# Patient Record
Sex: Female | Born: 1985 | Race: Black or African American | Hispanic: No | Marital: Single | Smoking: Former smoker
Health system: Southern US, Community
[De-identification: ages and names within clinical notes are randomized; demographics above are authoritative.]

## PROBLEM LIST (undated history)

## (undated) DIAGNOSIS — D649 Anemia, unspecified: Secondary | ICD-10-CM

## (undated) HISTORY — DX: Anemia, unspecified: D64.9

---

## 2015-05-31 DIAGNOSIS — F17200 Nicotine dependence, unspecified, uncomplicated: Secondary | ICD-10-CM | POA: Insufficient documentation

## 2015-05-31 DIAGNOSIS — R51 Headache: Secondary | ICD-10-CM | POA: Insufficient documentation

## 2015-06-01 ENCOUNTER — Encounter (HOSPITAL_BASED_OUTPATIENT_CLINIC_OR_DEPARTMENT_OTHER): Payer: Self-pay

## 2015-06-01 ENCOUNTER — Emergency Department (HOSPITAL_BASED_OUTPATIENT_CLINIC_OR_DEPARTMENT_OTHER): Payer: Medicaid Other

## 2015-06-01 ENCOUNTER — Emergency Department (HOSPITAL_BASED_OUTPATIENT_CLINIC_OR_DEPARTMENT_OTHER)
Admission: EM | Admit: 2015-06-01 | Discharge: 2015-06-01 | Disposition: A | Discharge status: Home / Self Care | Payer: Medicaid Other | Attending: Emergency Medicine | Admitting: Emergency Medicine

## 2015-06-01 ENCOUNTER — Emergency Department (HOSPITAL_COMMUNITY)
Admission: EM | Admit: 2015-06-01 | Discharge: 2015-06-01 | Discharge status: Against Medical Advice | Payer: Medicaid Other | Attending: Emergency Medicine | Admitting: Emergency Medicine

## 2015-06-01 ENCOUNTER — Encounter (HOSPITAL_COMMUNITY): Payer: Self-pay | Admitting: *Deleted

## 2015-06-01 DIAGNOSIS — E119 Type 2 diabetes mellitus without complications: Secondary | ICD-10-CM | POA: Insufficient documentation

## 2015-06-01 DIAGNOSIS — J029 Acute pharyngitis, unspecified: Secondary | ICD-10-CM | POA: Insufficient documentation

## 2015-06-01 DIAGNOSIS — R630 Anorexia: Secondary | ICD-10-CM | POA: Insufficient documentation

## 2015-06-01 DIAGNOSIS — R102 Pelvic and perineal pain: Secondary | ICD-10-CM

## 2015-06-01 DIAGNOSIS — N83209 Unspecified ovarian cyst, unspecified side: Secondary | ICD-10-CM

## 2015-06-01 DIAGNOSIS — M791 Myalgia: Secondary | ICD-10-CM | POA: Insufficient documentation

## 2015-06-01 DIAGNOSIS — I10 Essential (primary) hypertension: Secondary | ICD-10-CM | POA: Insufficient documentation

## 2015-06-01 DIAGNOSIS — Z79899 Other long term (current) drug therapy: Secondary | ICD-10-CM | POA: Insufficient documentation

## 2015-06-01 LAB — COMPREHENSIVE METABOLIC PANEL
ALBUMIN: 3.6 g/dL (ref 3.5–5.0)
ALK PHOS: 45 U/L (ref 38–126)
ALT: 20 U/L (ref 14–54)
ANION GAP: 9 (ref 5–15)
AST: 21 U/L (ref 15–41)
BUN: 11 mg/dL (ref 6–20)
CALCIUM: 9.1 mg/dL (ref 8.9–10.3)
CO2: 25 mmol/L (ref 22–32)
CREATININE: 0.85 mg/dL (ref 0.44–1.00)
Chloride: 106 mmol/L (ref 101–111)
GFR calc Af Amer: >60 mL/min (ref >60)
GFR calc non Af Amer: >60 mL/min (ref >60)
GLUCOSE: 116 mg/dL — AB (ref 65–99)
Potassium: 3.6 mmol/L (ref 3.5–5.1)
SODIUM: 140 mmol/L (ref 135–145)
TOTAL PROTEIN: 6.5 g/dL (ref 6.5–8.1)

## 2015-06-01 LAB — URINALYSIS, ROUTINE W REFLEX MICROSCOPIC
BILIRUBIN URINE: NEGATIVE
GLUCOSE, UA: NEGATIVE mg/dL
Glucose, UA: NEGATIVE mg/dL
HGB URINE DIPSTICK: NEGATIVE
HGB URINE DIPSTICK: NEGATIVE
KETONES UR: NEGATIVE mg/dL
Ketones, ur: NEGATIVE mg/dL
Leukocytes, UA: NEGATIVE
Leukocytes, UA: NEGATIVE
NITRITE: NEGATIVE
Nitrite: NEGATIVE
PROTEIN: NEGATIVE mg/dL
Protein, ur: NEGATIVE mg/dL
SPECIFIC GRAVITY, URINE: 1.025 (ref 1.005–1.030)
Specific Gravity, Urine: 1.024 (ref 1.005–1.030)
pH: 6 (ref 5.0–8.0)
pH: 7 (ref 5.0–8.0)

## 2015-06-01 LAB — CBC WITH DIFFERENTIAL/PLATELET
Basophils Absolute: 0 10*3/uL (ref 0.0–0.1)
Basophils Relative: 0 %
EOS ABS: 0.2 10*3/uL (ref 0.0–0.7)
EOS PCT: 2 %
HCT: 30.1 % — ABNORMAL LOW (ref 36.0–46.0)
Hemoglobin: 9.6 g/dL — ABNORMAL LOW (ref 12.0–15.0)
Lymphocytes Relative: 34 %
Lymphs Abs: 3.3 10*3/uL (ref 0.7–4.0)
MCH: 23.1 pg — AB (ref 26.0–34.0)
MCHC: 31.9 g/dL (ref 30.0–36.0)
MCV: 72.5 fL — ABNORMAL LOW (ref 78.0–100.0)
MONOS PCT: 10 %
Monocytes Absolute: 1 10*3/uL (ref 0.1–1.0)
Neutro Abs: 5.2 10*3/uL (ref 1.7–7.7)
Neutrophils Relative %: 54 %
PLATELETS: 306 10*3/uL (ref 150–400)
RBC: 4.15 MIL/uL (ref 3.87–5.11)
RDW: 16.6 % — ABNORMAL HIGH (ref 11.5–15.5)
WBC: 9.7 10*3/uL (ref 4.0–10.5)

## 2015-06-01 LAB — WET PREP, GENITAL
Sperm: NONE SEEN
TRICH WET PREP: NONE SEEN
Yeast Wet Prep HPF POC: NONE SEEN

## 2015-06-01 LAB — CBC
HCT: 31.5 % — ABNORMAL LOW (ref 36.0–46.0)
Hemoglobin: 10.4 g/dL — ABNORMAL LOW (ref 12.0–15.0)
MCH: 24 pg — AB (ref 26.0–34.0)
MCHC: 33 g/dL (ref 30.0–36.0)
MCV: 72.7 fL — ABNORMAL LOW (ref 78.0–100.0)
PLATELETS: 315 10*3/uL (ref 150–400)
RBC: 4.33 MIL/uL (ref 3.87–5.11)
RDW: 16.3 % — AB (ref 11.5–15.5)
WBC: 10.1 10*3/uL (ref 4.0–10.5)

## 2015-06-01 LAB — PREGNANCY, URINE: PREG TEST UR: NEGATIVE

## 2015-06-01 LAB — LIPASE, BLOOD: Lipase: 23 U/L (ref 11–51)

## 2015-06-01 LAB — POC URINE PREG, ED: Preg Test, Ur: NEGATIVE

## 2015-06-01 MED ORDER — KETOROLAC TROMETHAMINE 60 MG/2ML IM SOLN
30.0000 mg | Freq: Once | INTRAMUSCULAR | Status: AC
  Administered 2015-06-01: 30 mg via INTRAMUSCULAR
  Filled 2015-06-01: qty 2

## 2015-06-01 MED ORDER — NAPROXEN SODIUM 275 MG PO TABS: 275.0000 mg | ORAL_TABLET | Freq: Two times a day (BID) | ORAL | Status: DC

## 2015-06-01 NOTE — ED Notes (Signed)
Pt stated that she does not have "another 3 hours to wait." Tech first stated that she was the next person to get a room and she stated she still wanted to leave. Pt voiced understanding about the risks of leaving.

## 2015-06-01 NOTE — Discharge Instructions (Signed)
Your ultrasound today shows that you had a cyst on your ovary that ruptured. This could cause sudden severe pain. We are treating you for the pain. Follow up with your GYN for further evaluation.  Your blood work today shows that you are anemic. You should be sure your are taking a vitamin every day and iron. It is important that you see your doctor for follow up.

## 2015-06-01 NOTE — ED Notes (Signed)
The pt is c/o being ill for one week.  Headache and pain   lmp last week

## 2015-06-01 NOTE — ED Provider Notes (Signed)
CSN: [id number]     Arrival date & time 06/01/15  1424 History   First MD Initiated Contact with Patient 06/01/15 1527     Chief Complaint  Patient presents with  . Abdominal Pain     (Consider location/radiation/quality/duration/timing/severity/associated sxs/prior Treatment) Patient is a 30 y.o. female presenting with abdominal pain. The history is provided by the patient.  Abdominal Pain Pain location:  LLQ and RLQ Pain quality: aching and squeezing   Pain radiates to:  Does not radiate Pain severity:  Moderate Onset quality:  Gradual Duration:  2 days Timing:  Constant Progression:  Worsening Chronicity:  Recurrent Relieved by:  Heat Ineffective treatments:  OTC medications Associated symptoms: constipation and nausea   Associated symptoms: no diarrhea, no dysuria, no hematuria and no vomiting    Zaelee Biddix is a 30 y.o. female who presents to the ED with abdominal pain. She reports having had problems off and on for the past 6 years but the pain comes and goes. Yesterday she began having pain that is worse than usual. The pain is usually related to menses. LMP 05/20/15.   History reviewed. No pertinent past medical history. History reviewed. No pertinent past surgical history. No family history on file. Social History  Substance Use Topics  . Smoking status: Current Every Day Smoker  . Smokeless tobacco: None  . Alcohol Use: No   OB History    No data available     Review of Systems  Gastrointestinal: Positive for nausea, abdominal pain and constipation. Negative for vomiting and diarrhea.  Genitourinary: Negative for dysuria and hematuria.  all other systems negative    Allergies  Review of patient's allergies indicates no known allergies.  Home Medications   Prior to Admission medications   Medication Sig Start Date End Date Taking? Authorizing Provider  naproxen sodium (ANAPROX) 275 MG tablet Take 1 tablet (275 mg total) by mouth 2 (two) times daily  with a meal. 06/01/15   Hope Bunnie Pion, NP   BP 113/72 mmHg  Pulse 62  Temp(Src) 98.1 F (36.7 C) (Oral)  Resp 18  Ht 5\' 3"  (1.6 m)  Wt 51.71 kg  BMI 20.20 kg/m2  SpO2 100%  LMP 05/20/2015 Physical Exam  Constitutional: She is oriented to person, place, and time. She appears well-developed and well-nourished. No distress.  HENT:  Head: Normocephalic.  Eyes: Conjunctivae and EOM are normal.  Neck: Normal range of motion. Neck supple.  Cardiovascular: Normal rate.   Pulmonary/Chest: Effort normal.  Abdominal: Soft. There is tenderness in the left lower quadrant. There is no rebound, no guarding and no CVA tenderness.  Genitourinary:  External genital without lesions, cervix without lesions, mild CMT, right adnexal tenderness. Uterus without palpable enlargement.   Musculoskeletal: Normal range of motion.  Neurological: She is alert and oriented to person, place, and time. No cranial nerve deficit.  Skin: Skin is warm and dry.  Psychiatric: She has a normal mood and affect. Her behavior is normal.  Nursing note and vitals reviewed.   ED Course  Procedures (including critical care time) Labs, ultrasound, Toradol for pain Pain improved with Toradol  Labs Review Results for orders placed or performed during the hospital encounter of 06/01/15 (from the past 24 hour(s))  Urinalysis, Routine w reflex microscopic (not at [facility])     Status: None   Collection Time: 06/01/15  2:47 PM  Result Value Ref Range   Color, Urine YELLOW YELLOW   APPearance CLEAR CLEAR   Specific Gravity, Urine 1.024  1.005 - 1.030   pH 7.0 5.0 - 8.0   Glucose, UA NEGATIVE NEGATIVE mg/dL   Hgb urine dipstick NEGATIVE NEGATIVE   Bilirubin Urine NEGATIVE NEGATIVE   Ketones, ur NEGATIVE NEGATIVE mg/dL   Protein, ur NEGATIVE NEGATIVE mg/dL   Nitrite NEGATIVE NEGATIVE   Leukocytes, UA NEGATIVE NEGATIVE  Pregnancy, urine     Status: None   Collection Time: 06/01/15  2:47 PM  Result Value Ref Range   Preg Test, Ur  NEGATIVE NEGATIVE  CBC with Differential/Platelet     Status: Abnormal   Collection Time: 06/01/15  3:47 PM  Result Value Ref Range   WBC 9.7 4.0 - 10.5 K/uL   RBC 4.15 3.87 - 5.11 MIL/uL   Hemoglobin 9.6 (L) 12.0 - 15.0 g/dL   HCT 30.1 (L) 36.0 - 46.0 %   MCV 72.5 (L) 78.0 - 100.0 fL   MCH 23.1 (L) 26.0 - 34.0 pg   MCHC 31.9 30.0 - 36.0 g/dL   RDW 16.6 (H) 11.5 - 15.5 %   Platelets 306 150 - 400 K/uL   Neutrophils Relative % 54 %   Neutro Abs 5.2 1.7 - 7.7 K/uL   Lymphocytes Relative 34 %   Lymphs Abs 3.3 0.7 - 4.0 K/uL   Monocytes Relative 10 %   Monocytes Absolute 1.0 0.1 - 1.0 K/uL   Eosinophils Relative 2 %   Eosinophils Absolute 0.2 0.0 - 0.7 K/uL   Basophils Relative 0 %   Basophils Absolute 0.0 0.0 - 0.1 K/uL  Wet prep, genital     Status: Abnormal   Collection Time: 06/01/15  5:35 PM  Result Value Ref Range   Yeast Wet Prep HPF POC NONE SEEN NONE SEEN   Trich, Wet Prep NONE SEEN NONE SEEN   Clue Cells Wet Prep HPF POC PRESENT (A) NONE SEEN   WBC, Wet Prep HPF POC MODERATE (A) NONE SEEN   Sperm NONE SEEN      Imaging Review US Transvaginal Non-ob  06/01/2015  CLINICAL DATA:  Pelvic pain for 6 years EXAM: TRANSABDOMINAL AND TRANSVAGINAL ULTRASOUND OF PELVIS TECHNIQUE: Both transabdominal and transvaginal ultrasound examinations of the pelvis were performed. Transabdominal technique was performed for global imaging of the pelvis including uterus, ovaries, adnexal regions, and pelvic cul-de-sac. It was necessary to proceed with endovaginal exam following the transabdominal exam to visualize the ovaries. COMPARISON:  None FINDINGS: Uterus Measurements: 9.2 x 4.7 x 6.0 cm. No fibroids or other mass visualized. Endometrium Thickness: 12 mm.  No focal abnormality visualized. Right ovary Measurements: 3.5 x 2.3 x 1.2 cm. Normal appearance/no adnexal mass. Left ovary Measurements: 3.6 x 2.2 x 03.4 cm. There is a 2.6 cm cystic lesion with irregular borders identified likely  representing an involuting cyst. Other findings Moderate free fluid is noted. This is likely related to recent cystic rupture. IMPRESSION: Apparent involuting cyst in the left ovary with free pelvic fluid consistent with recent rupture. Electronically Signed   By: Inez Catalina M.D.   On: 06/01/2015 17:08   US Pelvis Complete  06/01/2015  CLINICAL DATA:  Pelvic pain for 6 years EXAM: TRANSABDOMINAL AND TRANSVAGINAL ULTRASOUND OF PELVIS TECHNIQUE: Both transabdominal and transvaginal ultrasound examinations of the pelvis were performed. Transabdominal technique was performed for global imaging of the pelvis including uterus, ovaries, adnexal regions, and pelvic cul-de-sac. It was necessary to proceed with endovaginal exam following the transabdominal exam to visualize the ovaries. COMPARISON:  None FINDINGS: Uterus Measurements: 9.2 x 4.7 x 6.0 cm. No fibroids  or other mass visualized. Endometrium Thickness: 12 mm.  No focal abnormality visualized. Right ovary Measurements: 3.5 x 2.3 x 1.2 cm. Normal appearance/no adnexal mass. Left ovary Measurements: 3.6 x 2.2 x 03.4 cm. There is a 2.6 cm cystic lesion with irregular borders identified likely representing an involuting cyst. Other findings Moderate free fluid is noted. This is likely related to recent cystic rupture. IMPRESSION: Apparent involuting cyst in the left ovary with free pelvic fluid consistent with recent rupture. Electronically Signed   By: Inez Catalina M.D.   On: 06/01/2015 17:08   I have personally reviewed and evaluated these images and lab results as part of my medical decision-making.   MDM  30 y.o. female with pelvic pain that improved with Toradol. Stable for d/c without ovarian torsion or acute abdomen. Discussed with the patient lab, ultrasound and clinical findings and plan of care and all questioned fully answered. She will follow up with her GYN or return here as needed. Rx for Anaprox.    Final diagnoses:  Pelvic pain in female   Ovarian cyst rupture       [facility], NP 06/03/15 2224  Davonna Belling, MD 06/03/15 2240

## 2015-06-01 NOTE — ED Notes (Signed)
Pelvic cart is set up at bedside.

## 2015-06-01 NOTE — ED Notes (Signed)
Patient transported to Ultrasound 

## 2015-06-01 NOTE — ED Notes (Signed)
Pt states she went to [facility] ED late night for same c/o-LWBS

## 2015-06-01 NOTE — ED Notes (Signed)
Abd pain "for years"-worse yesterday-denies n/v/d-also c/o migraine started yesterday

## 2015-06-02 LAB — GC/CHLAMYDIA PROBE AMP (~~LOC~~) NOT AT ARMC
CHLAMYDIA, DNA PROBE: NEGATIVE
NEISSERIA GONORRHEA: NEGATIVE

## 2015-10-13 ENCOUNTER — Encounter: Payer: Self-pay | Admitting: Internal Medicine

## 2016-06-04 ENCOUNTER — Emergency Department (HOSPITAL_BASED_OUTPATIENT_CLINIC_OR_DEPARTMENT_OTHER)
Admission: EM | Admit: 2016-06-04 | Discharge: 2016-06-04 | Disposition: A | Discharge status: Home / Self Care | Payer: No Typology Code available for payment source | Attending: Emergency Medicine | Admitting: Emergency Medicine

## 2016-06-04 ENCOUNTER — Encounter (HOSPITAL_BASED_OUTPATIENT_CLINIC_OR_DEPARTMENT_OTHER): Payer: Self-pay | Admitting: *Deleted

## 2016-06-04 DIAGNOSIS — F17210 Nicotine dependence, cigarettes, uncomplicated: Secondary | ICD-10-CM | POA: Diagnosis not present

## 2016-06-04 DIAGNOSIS — Y9389 Activity, other specified: Secondary | ICD-10-CM | POA: Diagnosis not present

## 2016-06-04 DIAGNOSIS — Y999 Unspecified external cause status: Secondary | ICD-10-CM | POA: Diagnosis not present

## 2016-06-04 DIAGNOSIS — S199XXA Unspecified injury of neck, initial encounter: Secondary | ICD-10-CM | POA: Diagnosis present

## 2016-06-04 DIAGNOSIS — Y92410 Unspecified street and highway as the place of occurrence of the external cause: Secondary | ICD-10-CM | POA: Diagnosis not present

## 2016-06-04 DIAGNOSIS — S161XXA Strain of muscle, fascia and tendon at neck level, initial encounter: Secondary | ICD-10-CM | POA: Diagnosis not present

## 2016-06-04 MED ORDER — METHOCARBAMOL 500 MG PO TABS: 500.0000 mg | ORAL_TABLET | Freq: Three times a day (TID) | ORAL | 0 refills | Status: DC | PRN

## 2016-06-04 MED FILL — METHOCARBAMOL 500 MG TABLET: 500 | 3 days supply | Qty: 10 | Fill #0
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
## 2019-08-14 ENCOUNTER — Other Ambulatory Visit: Payer: Self-pay

## 2019-08-14 ENCOUNTER — Encounter (HOSPITAL_COMMUNITY): Payer: Self-pay

## 2019-08-14 ENCOUNTER — Ambulatory Visit (HOSPITAL_COMMUNITY)
Admission: EM | Admit: 2019-08-14 | Discharge: 2019-08-14 | Disposition: A | Discharge status: Home / Self Care | Payer: BC Managed Care – PPO | Attending: Internal Medicine | Admitting: Internal Medicine

## 2019-08-14 DIAGNOSIS — K047 Periapical abscess without sinus: Secondary | ICD-10-CM | POA: Diagnosis not present

## 2019-08-14 MED ORDER — IBUPROFEN 600 MG PO TABS: 600.0000 mg | ORAL_TABLET | Freq: Four times a day (QID) | ORAL | 0 refills | Status: DC | PRN

## 2019-08-14 MED ORDER — KETOROLAC TROMETHAMINE 30 MG/ML IJ SOLN
30.0000 mg | Freq: Once | INTRAMUSCULAR | Status: AC
  Administered 2019-08-14: 30 mg via INTRAMUSCULAR

## 2019-08-14 MED ORDER — KETOROLAC TROMETHAMINE 30 MG/ML IJ SOLN
INTRAMUSCULAR | Status: AC
  Filled 2019-08-14: qty 1

## 2019-08-14 NOTE — ED Provider Notes (Signed)
[place]    CSN: [id number] Arrival date & time: 08/14/19  1208      History   Chief Complaint Chief Complaint  Patient presents with  . Dental Pain    HPI Jamie Valdez is a 34 y.o. female comes to urgent care with right-sided facial swelling and increasing tooth ache.  Patient was seen at [facility] urgent care couple of days ago.  She was prescribed Augmentin and hydrocodone.  Patient symptoms started on the very day that she was seen in the urgent care.  Patient is compliant with her antibiotics.  Patient started spirits increasing swelling on the right side of the face.  Right maxillary tooth-premolar is a cause of her pain.  She denies any fever or chills.  Pain is currently severe, throbbing, constant, worse with chewing or touch.  Tylenol extra strength provided some relief. HPI  No past medical history on file.  There are no problems to display for this patient.   No past surgical history on file.  OB History   No obstetric history on file.      Home Medications    Prior to Admission medications   Medication Sig Start Date End Date Taking? Authorizing Provider  amoxicillin-clavulanate (AUGMENTIN) 875-125 MG tablet Take by mouth. 08/12/19 08/22/19 Yes [provider]  HYDROcodone-acetaminophen (NORCO/VICODIN) 5-325 MG tablet Take by mouth. 08/12/19  Yes [provider]  ibuprofen (ADVIL) 600 MG tablet Take 1 tablet (600 mg total) by mouth every 6 (six) hours as needed. 08/14/19   Trichelle Lehan, Myrene Galas, MD  methocarbamol (ROBAXIN) 500 MG tablet Take 1 tablet (500 mg total) by mouth every 8 (eight) hours as needed for muscle spasms. 06/04/16   Davonna Belling, MD  naproxen sodium (ANAPROX) 275 MG tablet Take 1 tablet (275 mg total) by mouth 2 (two) times daily with a meal. 06/01/15   Ashley Murrain, NP    Family History Family History  Family history unknown: Yes    Social History Social History   Tobacco Use  . Smoking status: Current  Every Day Smoker    Types: Cigarettes  . Smokeless tobacco: Never Used  Substance Use Topics  . Alcohol use: No  . Drug use: No     Allergies   Patient has no known allergies.   Review of Systems Review of Systems  Constitutional: Negative for activity change, chills, fatigue and fever.  HENT: Positive for dental problem and facial swelling. Negative for sinus pressure, sinus pain and sore throat.   Eyes: Negative for pain and itching.  Respiratory: Negative for chest tightness and shortness of breath.   Cardiovascular: Negative for chest pain.  Gastrointestinal: Negative for abdominal pain, diarrhea, nausea and vomiting.  Musculoskeletal: Negative.   Neurological: Positive for facial asymmetry. Negative for dizziness, light-headedness, numbness and headaches.     Physical Exam Triage Vital Signs ED Triage Vitals  Enc Vitals Group     BP 08/14/19 1238 102/71     Pulse Rate 08/14/19 1238 78     Resp 08/14/19 1238 18     Temp 08/14/19 1238 98.8 F (37.1 C)     Temp Source 08/14/19 1238 Oral     SpO2 08/14/19 1238 100 %     Weight --      Height --      Head Circumference --      Peak Flow --      Pain Score 08/14/19 1236 8     Pain Loc --  Pain Edu? --      Excl. in [place]? --    No data found.  Updated Vital Signs BP 102/71 (BP Location: Left Arm)   Pulse 78   Temp 98.8 F (37.1 C) (Oral)   Resp 18   LMP 08/01/2019   SpO2 100%   Visual Acuity Right Eye Distance:   Left Eye Distance:   Bilateral Distance:    Right Eye Near:   Left Eye Near:    Bilateral Near:     Physical Exam Vitals and nursing note reviewed.  Constitutional:      General: She is in acute distress.     Appearance: She is not ill-appearing.  HENT:     Mouth/Throat:     Comments: Right-sided facial swelling without erythema.  Tenderness on palpation over the gingiva in the area of the first right maxillary premolar Cardiovascular:     Rate and Rhythm: Normal rate and regular  rhythm.     Pulses: Normal pulses.     Heart sounds: Normal heart sounds.  Pulmonary:     Effort: Pulmonary effort is normal.     Breath sounds: Normal breath sounds.  Skin:    Capillary Refill: Capillary refill takes less than 2 seconds.  Neurological:     General: No focal deficit present.     Mental Status: She is alert and oriented to person, place, and time.      UC Treatments / Results  Labs (all labs ordered are listed, but only abnormal results are displayed) Labs Reviewed - No data to display  EKG   Radiology No results found.  Procedures Procedures (including critical care time)  Medications Ordered in UC Medications  ketorolac (TORADOL) 30 MG/ML injection 30 mg (30 mg Intramuscular Given 08/14/19 1344)    Initial Impression / Assessment and Plan / UC Course  I have reviewed the triage vital signs and the nursing notes.  Pertinent labs & imaging results that were available during my care of the patient were reviewed by me and considered in my medical decision making (see chart for details).     1.  Dental abscess: Toradol 30 mg IM x1 dose Ibuprofen 600 mg every 8 hours Tylenol extra strength as needed for breakthrough pain Continue Augmentin Patient is advised to see dentist for dental care. Final Clinical Impressions(s) / UC Diagnoses   Final diagnoses:  Dental abscess   Discharge Instructions   None    ED Prescriptions    Medication Sig Dispense Auth. Provider   ibuprofen (ADVIL) 600 MG tablet Take 1 tablet (600 mg total) by mouth every 6 (six) hours as needed. 30 tablet [place] Dunnigan, Myrene Galas, MD     PDMP not reviewed this encounter.   Chase Picket, MD 08/14/19 1427

## 2019-08-14 NOTE — ED Triage Notes (Signed)
Pt c/o tooth/dental pain for approx two days on lower right side. States she was seen and tx at an urgent care center for the same two days ago and given Rx amoxicillin and hydrocodone. Pt states that yesterday she right facial swelling and upper right tooth pain as well.  Right side of face swollen, lower right eye orbital area swollen/red. Reports bad taste in mouth. Reports fever of 100.0 two nights ago.

## 2020-06-15 ENCOUNTER — Emergency Department (HOSPITAL_COMMUNITY)
Admission: EM | Admit: 2020-06-15 | Discharge: 2020-06-15 | Disposition: A | Discharge status: Home / Self Care | Payer: BC Managed Care – PPO | Attending: Emergency Medicine | Admitting: Emergency Medicine

## 2020-06-15 DIAGNOSIS — F17210 Nicotine dependence, cigarettes, uncomplicated: Secondary | ICD-10-CM | POA: Diagnosis not present

## 2020-06-15 DIAGNOSIS — U071 COVID-19: Secondary | ICD-10-CM | POA: Insufficient documentation

## 2020-06-15 DIAGNOSIS — M791 Myalgia, unspecified site: Secondary | ICD-10-CM | POA: Diagnosis present

## 2020-06-15 LAB — RESP PANEL BY RT-PCR (FLU A&B, COVID) ARPGX2
Influenza A by PCR: NEGATIVE
Influenza B by PCR: NEGATIVE
SARS Coronavirus 2 by RT PCR: POSITIVE — AB

## 2020-06-15 MED ORDER — ONDANSETRON 4 MG PO TBDP
4.0000 mg | ORAL_TABLET | Freq: Once | ORAL | Status: AC
  Administered 2020-06-15: 4 mg via ORAL
  Filled 2020-06-15: qty 1

## 2020-06-15 MED ORDER — CYCLOBENZAPRINE HCL 10 MG PO TABS: 5.0000 mg | ORAL_TABLET | Freq: Three times a day (TID) | ORAL | 0 refills | Status: DC | PRN

## 2020-06-15 MED ORDER — KETOROLAC TROMETHAMINE 15 MG/ML IJ SOLN
15.0000 mg | Freq: Once | INTRAMUSCULAR | Status: AC
  Administered 2020-06-15: 15 mg via INTRAMUSCULAR
  Filled 2020-06-15: qty 1

## 2020-06-15 MED ORDER — ONDANSETRON 4 MG PO TBDP: 4.0000 mg | ORAL_TABLET | Freq: Three times a day (TID) | ORAL | 0 refills | Status: DC | PRN

## 2020-06-15 NOTE — ED Provider Notes (Signed)
[facility] EMERGENCY DEPARTMENT Provider Note   CSN: [id number] Arrival date & time: 06/15/20  1111     History No chief complaint on file.   Jamie Valdez is a 35 y.o. female.  Flulike symptoms to include fatigue chills myalgias, nausea, no vaccine for COVID.  No major medical problems.  Tolerating p.o., breathing comfortably, no chest pain, does have headache but no numbness or tingling no syncope.        No past medical history on file.  There are no problems to display for this patient.   No past surgical history on file.   OB History   No obstetric history on file.     Family History  Family history unknown: Yes    Social History   Tobacco Use  . Smoking status: Current Every Day Smoker    Types: Cigarettes  . Smokeless tobacco: Never Used  Substance Use Topics  . Alcohol use: No  . Drug use: No    Home Medications Prior to Admission medications   Medication Sig Start Date End Date Taking? Authorizing Provider  cyclobenzaprine (FLEXERIL) 10 MG tablet Take 0.5 tablets (5 mg total) by mouth 3 (three) times daily as needed for up to 12 doses for muscle spasms. 06/15/20  Yes Breck Coons, MD  ondansetron (ZOFRAN ODT) 4 MG disintegrating tablet Take 1 tablet (4 mg total) by mouth every 8 (eight) hours as needed for up to 10 doses for nausea or vomiting. 06/15/20  Yes Breck Coons, MD  HYDROcodone-acetaminophen (NORCO/VICODIN) 5-325 MG tablet Take by mouth. 08/12/19   [provider]  ibuprofen (ADVIL) 600 MG tablet Take 1 tablet (600 mg total) by mouth every 6 (six) hours as needed. 08/14/19   Lamptey, Myrene Galas, MD  methocarbamol (ROBAXIN) 500 MG tablet Take 1 tablet (500 mg total) by mouth every 8 (eight) hours as needed for muscle spasms. 06/04/16   Davonna Belling, MD  naproxen sodium (ANAPROX) 275 MG tablet Take 1 tablet (275 mg total) by mouth 2 (two) times daily with a meal. 06/01/15   Ashley Murrain, NP    Allergies    Patient  has no known allergies.  Review of Systems   Review of Systems  Constitutional: Positive for activity change, appetite change, chills and fatigue. Negative for fever.  HENT: Positive for congestion and rhinorrhea.   Respiratory: Negative for cough and shortness of breath.   Cardiovascular: Negative for chest pain and palpitations.  Gastrointestinal: Negative for diarrhea, nausea and vomiting.  Genitourinary: Negative for difficulty urinating and dysuria.  Musculoskeletal: Positive for back pain and myalgias. Negative for arthralgias.  Skin: Negative for rash and wound.  Neurological: Positive for headaches. Negative for light-headedness.    Physical Exam Updated Vital Signs BP 111/77   Pulse (!) 102   Temp 99.5 F (37.5 C) (Oral)   Resp 18   SpO2 100%   Physical Exam Vitals and nursing note reviewed. Exam conducted with a chaperone present.  Constitutional:      General: She is not in acute distress.    Appearance: Normal appearance.  HENT:     Head: Normocephalic and atraumatic.     Right Ear: Tympanic membrane normal.     Left Ear: Tympanic membrane normal.     Nose: No rhinorrhea.     Mouth/Throat:     Mouth: Mucous membranes are moist.  Eyes:     General:        Right eye: No discharge.  Left eye: No discharge.     Conjunctiva/sclera: Conjunctivae normal.  Cardiovascular:     Rate and Rhythm: Normal rate and regular rhythm.     Heart sounds: No murmur heard. No gallop.   Pulmonary:     Effort: Pulmonary effort is normal. No respiratory distress.     Breath sounds: No stridor. No wheezing, rhonchi or rales.  Chest:     Chest wall: No tenderness.  Abdominal:     General: Abdomen is flat. There is no distension.     Palpations: Abdomen is soft.     Tenderness: There is no abdominal tenderness. There is no guarding.  Musculoskeletal:        General: No tenderness or signs of injury.  Skin:    General: Skin is warm and dry.     Capillary Refill:  Capillary refill takes less than 2 seconds.  Neurological:     General: No focal deficit present.     Mental Status: She is alert. Mental status is at baseline.     Motor: No weakness.     Comments: 5 out of 5 motor strength in all extremities, sensation intact throughout, no dysmetria, no dysdiadochokinesia, no ataxia with ambulation, cranial nerves II through XII intact, alert and oriented to person place and time   Psychiatric:        Mood and Affect: Mood normal.        Behavior: Behavior normal.     ED Results / Procedures / Treatments   Labs (all labs ordered are listed, but only abnormal results are displayed) Labs Reviewed  RESP PANEL BY RT-PCR (FLU A&B, COVID) ARPGX2 - Abnormal; Notable for the following components:      Result Value   SARS Coronavirus 2 by RT PCR POSITIVE (*)    All other components within normal limits    EKG None  Radiology No results found.  Procedures Procedures (including critical care time)  Medications Ordered in ED Medications  ketorolac (TORADOL) 15 MG/ML injection 15 mg (has no administration in time range)  ondansetron (ZOFRAN-ODT) disintegrating tablet 4 mg (has no administration in time range)    ED Course  I have reviewed the triage vital signs and the nursing notes.  Pertinent labs & imaging results that were available during my care of the patient were reviewed by me and considered in my medical decision making (see chart for details).    MDM Rules/Calculators/A&P                          Patient does have COVID, symptoms consistent with viral illness, symptomatic control be provided.  No signs of endorgan dysfunction on physical exam or history.  Patient is overall well-appearing, has stable vital signs, tolerating p.o. looks well-hydrated normal work of breathing.  Safe for outpatient management.  Anti-inflammatories antiemetics given.  She is given muscle relaxers that she has bad back spasms at baseline and they are worse  now that she is sick.  No neurologic dysfunction safer discharge home return precautions provided quarantine precautions and instructions given.  Jamie Valdez was evaluated in Emergency Department on 06/15/2020 for the symptoms described in the history of present illness. She was evaluated in the context of the global COVID-19 pandemic, which necessitated consideration that the patient might be at risk for infection with the SARS-CoV-2 virus that causes COVID-19. Institutional protocols and algorithms that pertain to the evaluation of patients at risk for COVID-19 are in a state  of rapid change based on information released by regulatory bodies including the CDC and federal and state organizations. These policies and algorithms were followed during the patient's care in the ED.  Final Clinical Impression(s) / ED Diagnoses Final diagnoses:  COVID    Rx / DC Orders ED Discharge Orders         Ordered    ondansetron (ZOFRAN ODT) 4 MG disintegrating tablet  Every 8 hours PRN        06/15/20 1356    cyclobenzaprine (FLEXERIL) 10 MG tablet  3 times daily PRN        06/15/20 1356           Breck Coons, MD 06/15/20 1359

## 2020-06-15 NOTE — ED Triage Notes (Signed)
Patient complains of covid symptoms for 2-3 days. Body aches, headache, NAD

## 2020-06-15 NOTE — Discharge Instructions (Addendum)
You can take 600 mg of ibuprofen every 6 hours, you can take 1000 mg of Tylenol every 6 hours, you can alternate these every 3 or you can take them together.  Hydrate well with half Gatorade half water or Pedialyte.  Return to the emergency department with inability to take oral hydration and with any new symptoms that you are concerned with, or inability to breathe comfortably.

## 2020-06-15 NOTE — ED Notes (Signed)
Discharge instructions reviewed. Confirmed understanding.

## 2020-06-16 ENCOUNTER — Encounter: Payer: Self-pay | Admitting: Physician Assistant

## 2020-12-11 ENCOUNTER — Encounter (HOSPITAL_COMMUNITY): Payer: Self-pay | Admitting: Emergency Medicine

## 2020-12-11 ENCOUNTER — Emergency Department (HOSPITAL_COMMUNITY)
Admission: EM | Admit: 2020-12-11 | Discharge: 2020-12-11 | Disposition: A | Discharge status: Home / Self Care | Payer: BC Managed Care – PPO | Attending: Emergency Medicine | Admitting: Emergency Medicine

## 2020-12-11 ENCOUNTER — Other Ambulatory Visit: Payer: Self-pay

## 2020-12-11 DIAGNOSIS — K0889 Other specified disorders of teeth and supporting structures: Secondary | ICD-10-CM | POA: Diagnosis present

## 2020-12-11 DIAGNOSIS — K047 Periapical abscess without sinus: Secondary | ICD-10-CM | POA: Insufficient documentation

## 2020-12-11 DIAGNOSIS — F17210 Nicotine dependence, cigarettes, uncomplicated: Secondary | ICD-10-CM | POA: Insufficient documentation

## 2020-12-11 MED ORDER — CHLORHEXIDINE GLUCONATE 0.12 % MT SOLN: 15.0000 mL | Freq: Two times a day (BID) | OROMUCOSAL | 0 refills | Status: DC

## 2020-12-11 MED ORDER — PENICILLIN V POTASSIUM 500 MG PO TABS
500.0000 mg | ORAL_TABLET | Freq: Four times a day (QID) | ORAL | 0 refills | Status: AC
Start: 2020-12-11 — End: 2020-12-21

## 2020-12-11 NOTE — ED Provider Notes (Signed)
[facility] EMERGENCY DEPARTMENT Provider Note   CSN: [id number] Arrival date & time: 12/11/20  1030     History Chief Complaint  Patient presents with   Abscess    dental    Jamie Valdez is a 35 y.o. female.  35 year old female with complaint of left upper dental pain and swelling.  Reports couple dental caries, was chewing something on the left side of her mouth on 4 July when she had significant pain followed by swelling of the left upper gums.  States that it left upper molar had been pulled 10 years ago.  Also has pain in the left ear.  Denies other trauma, fever, drainage.      History reviewed. No pertinent past medical history.  There are no problems to display for this patient.   No past surgical history on file.   OB History   No obstetric history on file.     Family History  Family history unknown: Yes    Social History   Tobacco Use   Smoking status: Every Day    Types: Cigarettes   Smokeless tobacco: Never  Substance Use Topics   Alcohol use: No   Drug use: No    Home Medications Prior to Admission medications   Medication Sig Start Date End Date Taking? Authorizing Provider  chlorhexidine (PERIDEX) 0.12 % solution Use as directed 15 mLs in the mouth or throat 2 (two) times daily. 12/11/20  Yes Tacy Learn, PA-C  penicillin v potassium (VEETID) 500 MG tablet Take 1 tablet (500 mg total) by mouth 4 (four) times daily for 10 days. 12/11/20 12/21/20 Yes Tacy Learn, PA-C  cyclobenzaprine (FLEXERIL) 10 MG tablet Take 0.5 tablets (5 mg total) by mouth 3 (three) times daily as needed for up to 12 doses for muscle spasms. 06/15/20   Breck Coons, MD  HYDROcodone-acetaminophen (NORCO/VICODIN) 5-325 MG tablet Take by mouth. 08/12/19   [provider]  ibuprofen (ADVIL) 600 MG tablet Take 1 tablet (600 mg total) by mouth every 6 (six) hours as needed. 08/14/19   Lamptey, Myrene Galas, MD  methocarbamol (ROBAXIN) 500 MG tablet Take  1 tablet (500 mg total) by mouth every 8 (eight) hours as needed for muscle spasms. 06/04/16   Davonna Belling, MD  naproxen sodium (ANAPROX) 275 MG tablet Take 1 tablet (275 mg total) by mouth 2 (two) times daily with a meal. 06/01/15   Neese, [place], NP  ondansetron (ZOFRAN ODT) 4 MG disintegrating tablet Take 1 tablet (4 mg total) by mouth every 8 (eight) hours as needed for up to 10 doses for nausea or vomiting. 06/15/20   Breck Coons, MD    Allergies    Patient has no known allergies.  Review of Systems   Review of Systems  Constitutional:  Negative for chills and fever.  HENT:  Positive for dental problem, ear pain and facial swelling. Negative for trouble swallowing and voice change.   Gastrointestinal:  Negative for vomiting.  Musculoskeletal:  Negative for neck pain and neck stiffness.  Skin:  Negative for rash and wound.  Allergic/Immunologic: Negative for immunocompromised state.  Neurological:  Negative for headaches.  Hematological:  Negative for adenopathy.  Psychiatric/Behavioral:  Negative for confusion.   All other systems reviewed and are negative.  Physical Exam Updated Vital Signs BP (!) 124/108 (BP Location: Left Arm)   Pulse 95   Temp 99 F (37.2 C) (Oral)   Resp 16   SpO2 98%  Physical Exam Vitals and nursing note reviewed.  Constitutional:      General: She is not in acute distress.    Appearance: She is well-developed. She is not diaphoretic.  HENT:     Head: Normocephalic and atraumatic.     Jaw: No trismus.     Right Ear: Tympanic membrane and ear canal normal.     Left Ear: Tympanic membrane and ear canal normal.     Nose: Nose normal.     Mouth/Throat:     Mouth: Mucous membranes are moist.   Eyes:     Conjunctiva/sclera: Conjunctivae normal.  Pulmonary:     Effort: Pulmonary effort is normal.  Musculoskeletal:     Cervical back: Neck supple.  Lymphadenopathy:     Cervical: No cervical adenopathy.  Skin:    General: Skin is warm and  dry.     Findings: No erythema or rash.  Neurological:     Mental Status: She is alert and oriented to person, place, and time.  Psychiatric:        Behavior: Behavior normal.    ED Results / Procedures / Treatments   Labs (all labs ordered are listed, but only abnormal results are displayed) Labs Reviewed - No data to display  EKG None  Radiology No results found.  Procedures Procedures   Medications Ordered in ED Medications - No data to display  ED Course  I have reviewed the triage vital signs and the nursing notes.  Pertinent labs & imaging results that were available during my care of the patient were reviewed by me and considered in my medical decision making (see chart for details).  Clinical Course as of 12/11/20 1158  Sun Dec 11, 4160  1041 35 year old female with left upper dental pain as above.  Plan is to refer to dentistry as well as resource list given, given prescription for antibiotics and mouth rinse. [LM]    Clinical Course User Index [LM] Roque Lias   MDM Rules/Calculators/A&P                           Final Clinical Impression(s) / ED Diagnoses Final diagnoses:  Dental abscess    Rx / DC Orders ED Discharge Orders          Ordered    penicillin v potassium (VEETID) 500 MG tablet  4 times daily        12/11/20 1153    chlorhexidine (PERIDEX) 0.12 % solution  2 times daily        12/11/20 1153             Roque Lias 12/11/20 1158    Lajean Saver, MD 12/11/20 1417

## 2020-12-11 NOTE — Discharge Instructions (Addendum)
Numbers above for dental care, also resource list.  Take antibiotics as prescribed. Can use prescribed mouth rinse or saltwater or listerine. Continue to brush with a soft toothbrush.  600mg  Advil liquid gel with 650mg  Tylenol every 6 hours as needed for pain.

## 2020-12-11 NOTE — ED Triage Notes (Signed)
Patient coming from home, complaint of dental abscess that has been painful since July 4th

## 2020-12-27 ENCOUNTER — Telehealth: Payer: Self-pay

## 2020-12-27 NOTE — Telephone Encounter (Signed)
   Sabryn Laforte DOB: [birth year] MRN: [id number]   RIDER WAIVER AND RELEASE OF LIABILITY  For purposes of improving physical access to our facilities, [facility] is pleased to partner with third parties to provide [facility] patients or other authorized individuals the option of convenient, on-demand ground transportation services (the [place]") through use of the technology service that enables users to request on-demand ground transportation from independent third-party providers.  By opting to use and accept these [employer], I, the undersigned, hereby agree on behalf of myself, and on behalf of any minor child using the Government social research officer for whom I am the parent or legal guardian, as follows:  Government social research officer provided to me are provided by independent third-party transportation providers who are not [employer] or employees and who are unaffiliated with [employer]. [facility] is neither a transportation carrier nor a common or public carrier. [facility] has no control over the quality or safety of the transportation that occurs as a result of the [employer]. [facility] cannot guarantee that any third-party transportation provider will complete any arranged transportation service. [facility] makes no representation, warranty, or guarantee regarding the reliability, timeliness, quality, safety, suitability, or availability of any of the Transport Services or that they will be error free. I fully understand that traveling by vehicle involves risks and dangers of serious bodily injury, including permanent disability, paralysis, and death. I agree, on behalf of myself and on behalf of any minor child using the Transport Services for whom I am the parent or legal guardian, that the entire risk arising out of my use of the [employer] remains solely with me, to the maximum extent permitted under applicable law. The [employer] are provided "as  is" and "as available." [facility] disclaims all representations and warranties, express, implied or statutory, not expressly set out in these terms, including the implied warranties of merchantability and fitness for a particular purpose. I hereby waive and release [facility], its agents, employees, officers, directors, representatives, insurers, attorneys, assigns, successors, subsidiaries, and affiliates from any and all past, present, or future claims, demands, liabilities, actions, causes of action, or suits of any kind directly or indirectly arising from acceptance and use of the [employer]. I further waive and release [facility] and its affiliates from all present and future liability and responsibility for any injury or death to persons or damages to property caused by or related to the use of the [employer]. I have read this Waiver and Release of Liability, and I understand the terms used in it and their legal significance. This Waiver is freely and voluntarily given with the understanding that my right (as well as the right of any minor child for whom I am the parent or legal guardian using the [employer]) to legal recourse against [facility] in connection with the [employer] is knowingly surrendered in return for use of these services.   I attest that I read the consent document to Bridgett Larsson, gave Ms. Fonnesbeck the opportunity to ask questions and answered the questions asked (if any). I affirm that Bridgett Larsson then provided consent for she's participation in this program.       Cameron Proud

## 2021-06-08 HISTORY — PX: MOUTH SURGERY: SHX715

## 2021-07-10 ENCOUNTER — Other Ambulatory Visit: Payer: Self-pay

## 2021-07-10 ENCOUNTER — Ambulatory Visit (INDEPENDENT_AMBULATORY_CARE_PROVIDER_SITE_OTHER): Payer: 59 | Admitting: Internal Medicine

## 2021-07-10 ENCOUNTER — Encounter: Payer: Self-pay | Admitting: Internal Medicine

## 2021-07-10 VITALS — BP 112/70 | HR 74 | Temp 99.0°F | Ht 63.0 in | Wt 113.4 lb

## 2021-07-10 DIAGNOSIS — Z803 Family history of malignant neoplasm of breast: Secondary | ICD-10-CM

## 2021-07-10 DIAGNOSIS — D259 Leiomyoma of uterus, unspecified: Secondary | ICD-10-CM | POA: Diagnosis not present

## 2021-07-10 DIAGNOSIS — Z1159 Encounter for screening for other viral diseases: Secondary | ICD-10-CM

## 2021-07-10 DIAGNOSIS — N6311 Unspecified lump in the right breast, upper outer quadrant: Secondary | ICD-10-CM | POA: Diagnosis not present

## 2021-07-10 DIAGNOSIS — F4321 Adjustment disorder with depressed mood: Secondary | ICD-10-CM | POA: Diagnosis not present

## 2021-07-10 DIAGNOSIS — D649 Anemia, unspecified: Secondary | ICD-10-CM | POA: Diagnosis not present

## 2021-07-10 DIAGNOSIS — Z2821 Immunization not carried out because of patient refusal: Secondary | ICD-10-CM

## 2021-07-10 NOTE — Progress Notes (Signed)
I,Jamie Valdez,acting as a Education administrator for Jamie Greenland, MD.,have documented all relevant documentation on the behalf of Jamie Greenland, MD,as directed by  Jamie Greenland, MD while in the presence of Jamie Greenland, MD.  This visit occurred during the SARS-CoV-2 public health emergency.  Safety protocols were in place, including screening questions prior to the visit, additional usage of staff PPE, and extensive cleaning of exam room while observing appropriate contact time as indicated for disinfecting solutions.  Subjective:     Patient ID: Jamie Valdez , female    DOB: December 03, 1985 , 36 y.o.   MRN: [id number]   Chief Complaint  Patient presents with   Establish Care   Other    Breast lump    HPI  The patient is here to establish care and for evaluation of mass in her right breast.  She states she first noticed the mass in her breast 3 months ago. A month ago, she noticed that it was getting larger in size. Yesterday, she noticed a new lump that is tender. She denies nipple discharge. She does state that her GYN did notice it, and she was advised that it was "nothing to worry about". She states her grandmother recently passed from stage 4 breast cancer, she passed within 6 months of her initial diagnosis.   She denies having previous PCP. She does want referral to a new GYN, states she is due for a Pap in March.   LMP- 06/21/21. She is originally from [place], [place]. States her Mom moved here at age 38. She is single with 3 daughters. She denies having any perinatal complications. 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 She states her home environment is stressful. She is a new grandmother as well.     Past Medical History:  Diagnosis Date   Anemia      Family History  Problem Relation Age of Onset   Diabetes Mother    Liver disease Mother     No current outpatient medications on file.   No Known Allergies   Review of Systems  Constitutional: Negative.   Eyes:  Negative.   Respiratory: Negative.    Cardiovascular: Negative.   Gastrointestinal: Negative.   Neurological: Negative.   Psychiatric/Behavioral:  Positive for dysphoric mood.     Today's Vitals   07/10/21 0844  BP: 112/70  Pulse: 74  Temp: 99 F (37.2 C)  Weight: 113 lb 6.4 oz (51.4 kg)  Height: 5' 3"  (1.6 m)   Body mass index is 20.09 kg/m.  Wt Readings from Last 3 Encounters:  07/10/21 113 lb 6.4 oz (51.4 kg)  06/04/16 111 lb (50.3 kg)  06/01/15 114 lb (51.7 kg)    BP Readings from Last 3 Encounters:  07/10/21 112/70  12/11/20 113/75  06/15/20 111/77    Objective:  Physical Exam Vitals and nursing note reviewed.  Constitutional:      Appearance: Normal appearance.  HENT:     Head: Normocephalic and atraumatic.     Nose:     Comments: Masked     Mouth/Throat:     Comments: Masked  Eyes:     Extraocular Movements: Extraocular movements intact.  Cardiovascular:     Rate and Rhythm: Normal rate and regular rhythm.     Heart sounds: Normal heart sounds.  Pulmonary:     Effort: Pulmonary effort is normal.     Breath sounds: Normal breath sounds.  Chest:  Breasts:    Tanner Score is 5.  Left: Normal.       Comments: Two masses right breast One closest to areola is irregularly shaped Musculoskeletal:     Cervical back: Normal range of motion.  Skin:    General: Skin is warm.  Neurological:     General: No focal deficit present.     Mental Status: She is alert.  Psychiatric:        Mood and Affect: Mood normal.        Behavior: Behavior normal.        Assessment And Plan:     1. Mass of upper outer quadrant of right breast Comments: She agrees to breast u/s and diagnostic mammo. She is in agreement with treatment plan.  - [place] Unlisted Procedure Breast; Future - MM Digital Diagnostic Unilat R; Future  2. Situational depression Comments: PHQ-9 performed, she declines therapy at this time. Advised to let me know if she changes her mind.  -  TSH  3. Anemia, unspecified type Comments: I will check CBC w/ iron panel. I will make further recommendations once her labs are available for review.  - CBC no Diff - Iron, TIBC and Ferritin Panel - TSH - CMP14+EGFR  4. Uterine leiomyoma, unspecified location Comments: She does report menorrhagia. I will refer her to GYN for ultrasound and further evaluation.  - Ambulatory referral to Gynecology  5. Encounter for HCV screening test for low risk patient Comments: I will check HCV today.  - Hepatitis C antibody  6. COVID-19 vaccination declined  7. Family history of breast cancer   Patient was given opportunity to ask questions. Patient verbalized understanding of the plan and was able to repeat key elements of the plan. All questions were answered to their satisfaction.   I, Jamie Greenland, MD, have reviewed all documentation for this visit. The documentation on 07/10/21 for the exam, diagnosis, procedures, and orders are all accurate and complete.   IF YOU HAVE BEEN REFERRED TO A SPECIALIST, IT MAY TAKE 1-2 WEEKS TO SCHEDULE/PROCESS THE REFERRAL. IF YOU HAVE NOT HEARD FROM US/SPECIALIST IN TWO WEEKS, PLEASE GIVE [place] A CALL AT [phone] X [phone].   THE PATIENT IS ENCOURAGED TO PRACTICE SOCIAL DISTANCING DUE TO THE COVID-19 PANDEMIC.

## 2021-07-10 NOTE — Patient Instructions (Signed)
Breast Tenderness Breast tenderness is a common problem for women of all ages, but may also occur in men. Breast tenderness may range from mild discomfort to severe pain. In women, the pain usually comes and goes with the menstrual cycle, but it can also be constant. Breast tenderness has many possible causes, including hormone changes, infections, and taking certain medicines. You may have tests, such as a mammogram or an ultrasound, to check for any unusual findings. Having breast tenderness usually does not mean that you have breast cancer. Follow these instructions at home: Managing pain and discomfort  If directed, put ice to the painful area. To do this: Put ice in a plastic bag. Place a towel between your skin and the bag. Leave the ice on for 20 minutes, 2-3 times a day. Wear a supportive bra, especially during exercise. You may also want to wear a supportive bra while sleeping if your breasts are very tender. Medicines Take over-the-counter and prescription medicines only as told by your health care provider. If the cause of your pain is infection, you may be prescribed an antibiotic medicine. If you were prescribed an antibiotic, take it as told by your health care provider. Do not stop taking the antibiotic even if you start to feel better. Eating and drinking Your health care provider may recommend that you lessen the amount of fat in your diet. You can do this by: Limiting fried foods. Cooking foods using methods such as baking, boiling, grilling, and broiling. Decrease the amount of caffeine in your diet. Instead, drink more water and choose caffeine-free drinks. General instructions  Keep a log of the days and times when your breasts are most tender. Ask your health care provider how to do breast exams at home. This will help you notice if you have an unusual growth or lump. Keep all follow-up visits as told by your health care provider. This is important. Contact a health care  provider if: Any part of your breast is hard, red, and hot to the touch. This may be a sign of infection. You are a woman and: Not breastfeeding and you have fluid, especially blood or pus, coming out of your nipples. Have a new or painful lump in your breast that remains after your menstrual period ends. You have a fever. Your pain does not improve or it gets worse. Your pain is interfering with your daily activities. Summary Breast tenderness may range from mild discomfort to severe pain. Breast tenderness has many possible causes, including hormone changes, infections, and taking certain medicines. It can be treated with ice, wearing a supportive bra, and medicines. Make changes to your diet if told to by your health care provider. This information is not intended to replace advice given to you by your health care provider. Make sure you discuss any questions you have with your health care provider. Document Revised: 10/06/2018 Document Reviewed: 10/06/2018 Elsevier Patient Education  2022 [employer]  

## 2021-07-11 LAB — TSH: TSH: 0.804 u[IU]/mL (ref 0.450–4.500)

## 2021-07-11 LAB — IRON,TIBC AND FERRITIN PANEL
Ferritin: 12 ng/mL — ABNORMAL LOW (ref 15–150)
Iron Saturation: 4 % — CL (ref 15–55)
Iron: 16 ug/dL — ABNORMAL LOW (ref 27–159)
Total Iron Binding Capacity: 424 ug/dL (ref 250–450)
UIBC: 408 ug/dL (ref 131–425)

## 2021-07-11 LAB — CBC
Hematocrit: 32.2 % — ABNORMAL LOW (ref 34.0–46.6)
Hemoglobin: 9.4 g/dL — ABNORMAL LOW (ref 11.1–15.9)
MCH: 20.2 pg — ABNORMAL LOW (ref 26.6–33.0)
MCHC: 29.2 g/dL — ABNORMAL LOW (ref 31.5–35.7)
MCV: 69 fL — ABNORMAL LOW (ref 79–97)
Platelets: 422 10*3/uL (ref 150–450)
RBC: 4.66 x10E6/uL (ref 3.77–5.28)
RDW: 17.4 % — ABNORMAL HIGH (ref 11.7–15.4)
WBC: 7.3 10*3/uL (ref 3.4–10.8)

## 2021-07-11 LAB — CMP14+EGFR
ALT: 9 IU/L (ref 0–32)
AST: 16 IU/L (ref 0–40)
Albumin/Globulin Ratio: 1.6 (ref 1.2–2.2)
Albumin: 4.5 g/dL (ref 3.8–4.8)
Alkaline Phosphatase: 61 IU/L (ref 44–121)
BUN/Creatinine Ratio: 23 (ref 9–23)
BUN: 19 mg/dL (ref 6–20)
Bilirubin Total: 0.2 mg/dL (ref 0.0–1.2)
CO2: 21 mmol/L (ref 20–29)
Calcium: 9.4 mg/dL (ref 8.7–10.2)
Chloride: 105 mmol/L (ref 96–106)
Creatinine, Ser: 0.83 mg/dL (ref 0.57–1.00)
Globulin, Total: 2.9 g/dL (ref 1.5–4.5)
Glucose: 95 mg/dL (ref 70–99)
Potassium: 3.8 mmol/L (ref 3.5–5.2)
Sodium: 139 mmol/L (ref 134–144)
Total Protein: 7.4 g/dL (ref 6.0–8.5)
eGFR: 94 mL/min/{1.73_m2} (ref >59)

## 2021-07-11 LAB — HEPATITIS C ANTIBODY: Hep C Virus Ab: NONREACTIVE

## 2021-07-13 ENCOUNTER — Other Ambulatory Visit: Payer: Self-pay | Admitting: Internal Medicine

## 2021-07-13 DIAGNOSIS — N6311 Unspecified lump in the right breast, upper outer quadrant: Secondary | ICD-10-CM

## 2021-07-14 ENCOUNTER — Other Ambulatory Visit: Payer: Self-pay

## 2021-07-14 MED ORDER — FUSION PLUS PO CAPS: 1.0000 | ORAL_CAPSULE | Freq: Every day | ORAL | 3 refills | Status: AC

## 2021-07-26 ENCOUNTER — Encounter: Payer: Self-pay | Admitting: Internal Medicine

## 2021-08-04 ENCOUNTER — Ambulatory Visit
Admission: RE | Admit: 2021-08-04 | Discharge: 2021-08-04 | Disposition: A | Discharge status: Home / Self Care | Payer: Medicaid Other | Source: Ambulatory Visit | Attending: Internal Medicine | Admitting: Internal Medicine

## 2021-08-04 ENCOUNTER — Ambulatory Visit
Admission: RE | Admit: 2021-08-04 | Discharge: 2021-08-04 | Disposition: A | Discharge status: Home / Self Care | Payer: 59 | Source: Ambulatory Visit | Attending: Internal Medicine | Admitting: Internal Medicine

## 2021-08-04 DIAGNOSIS — N6311 Unspecified lump in the right breast, upper outer quadrant: Secondary | ICD-10-CM

## 2022-10-01 IMAGING — MG DIGITAL DIAGNOSTIC BILAT W/ TOMO W/ CAD
6 of 12 series · 6 of 36 positions shown · non-contrast
Comparison: None.

CLINICAL DATA: Mass felt by the patient in the right breast since
[REDACTED] or March 2021. This morning, she noticed a small amount
of white nipple discharge on the right when in the shower. History
of breast cancer in her maternal grandmother.

EXAM:
DIGITAL DIAGNOSTIC BILATERAL MAMMOGRAM WITH TOMOSYNTHESIS AND CAD;
ULTRASOUND RIGHT BREAST LIMITED
TECHNIQUE: Bilateral digital diagnostic mammography and breast tomosynthesis
was performed. The images were evaluated with computer-aided
detection.; Targeted ultrasound examination of the right breast was
performed

[L MLO synth-2D]
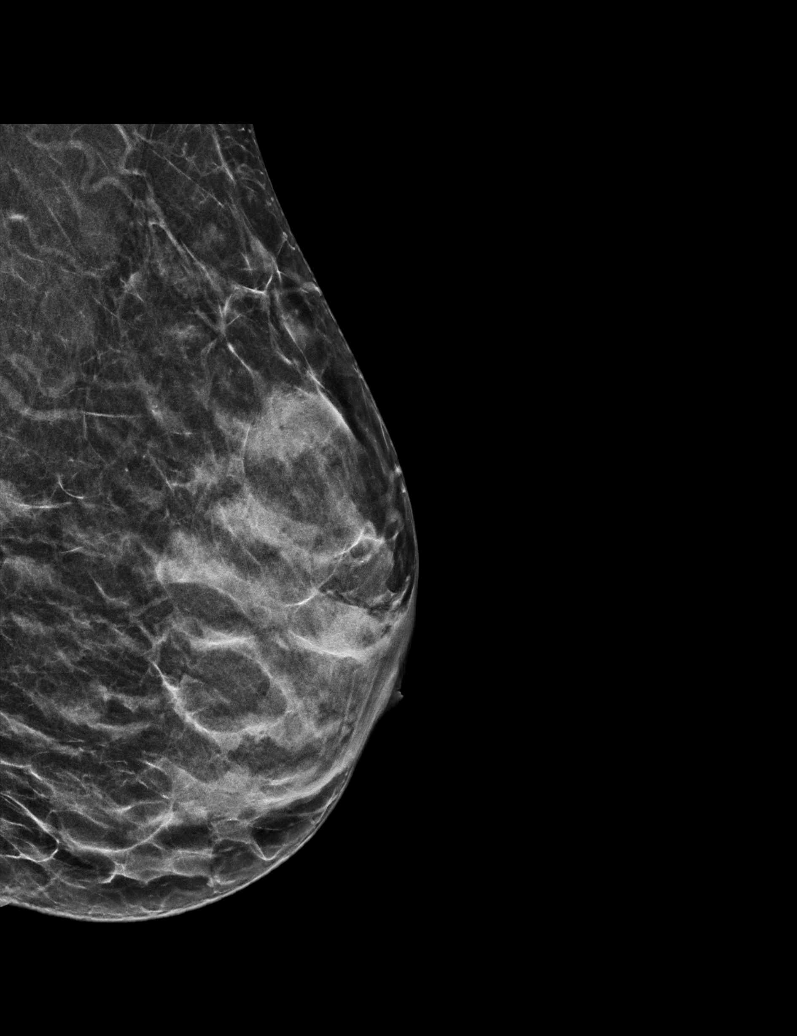

[R MLO synth-2D (1 of 2)]
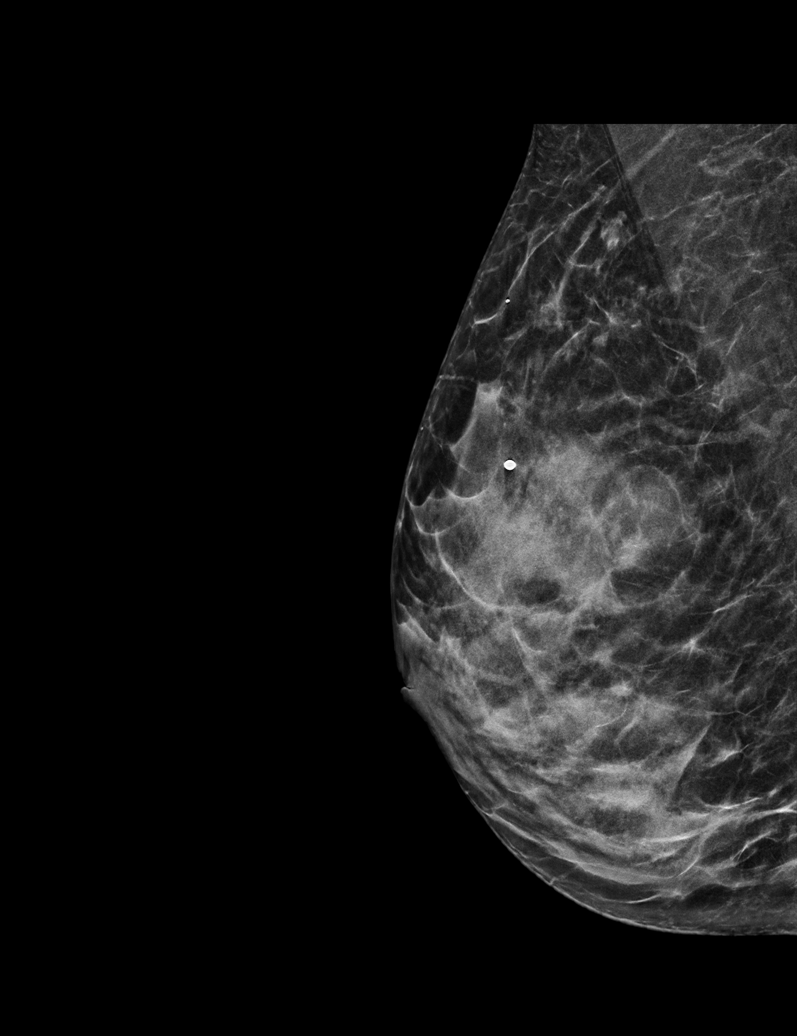

[R MLO synth-2D (2 of 2)]
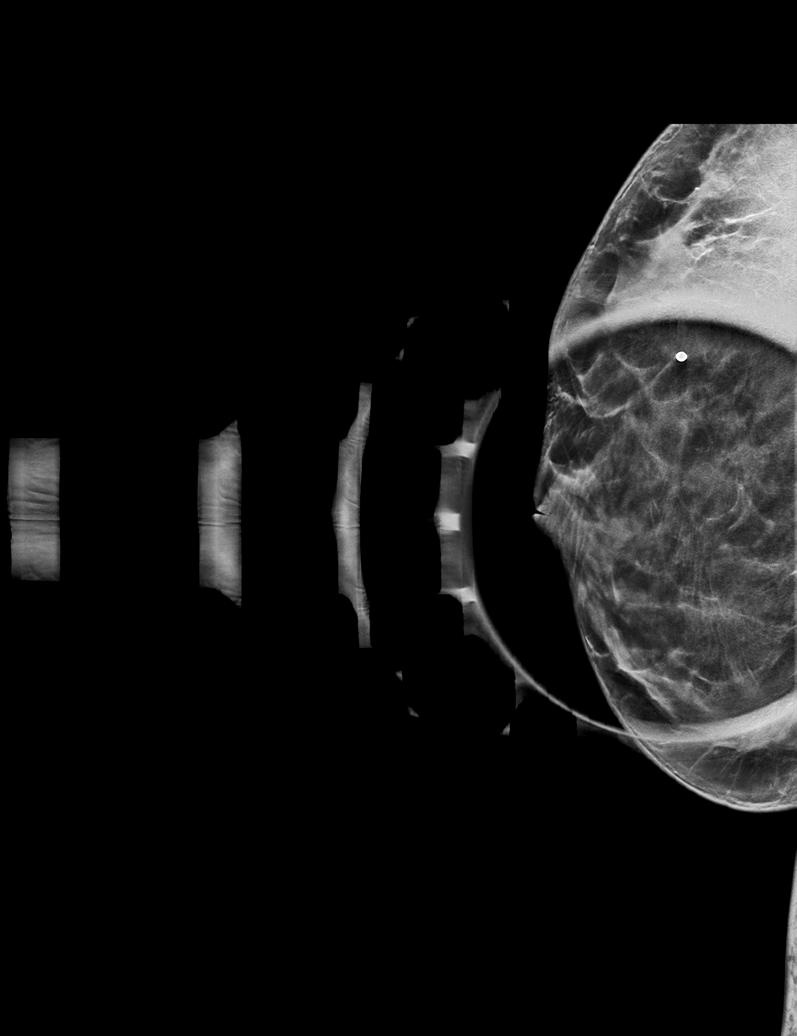

[L CC synth-2D]
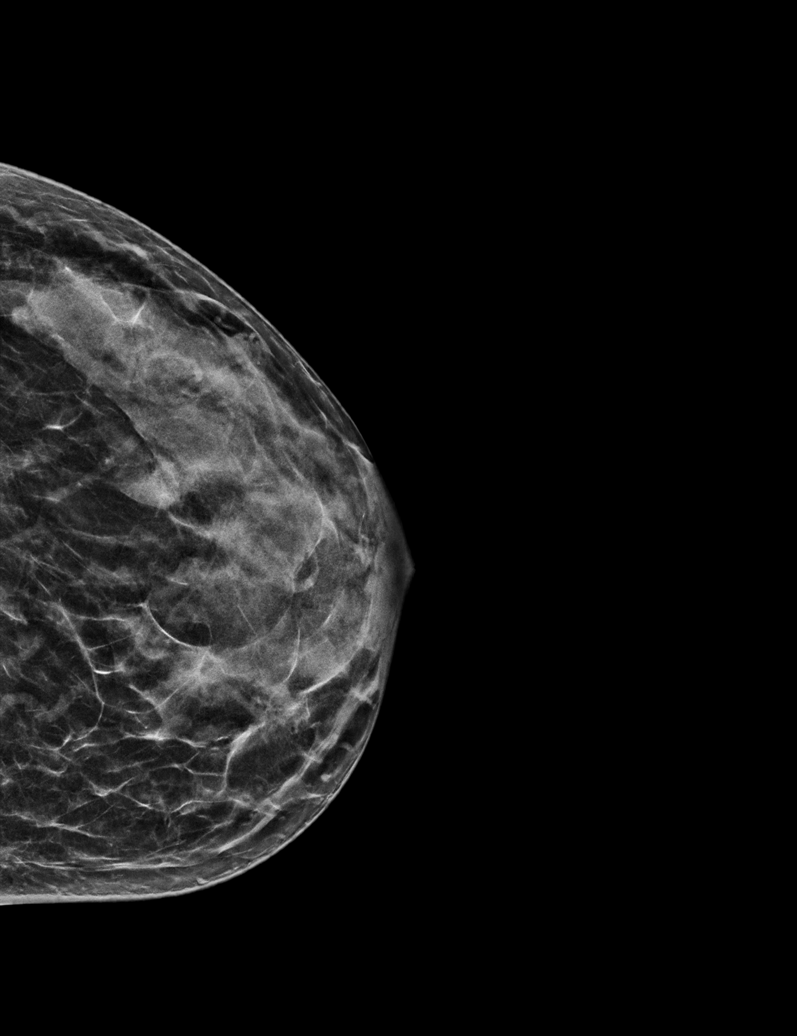

[R TAN synth-2D]
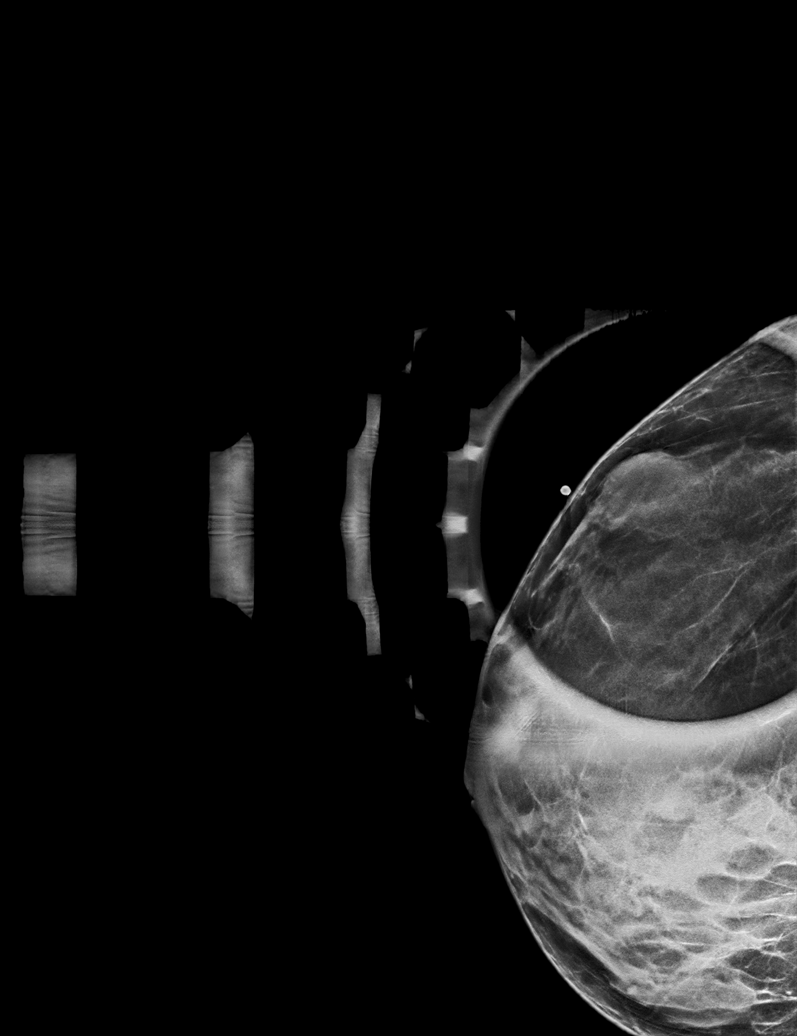

[R CC synth-2D]
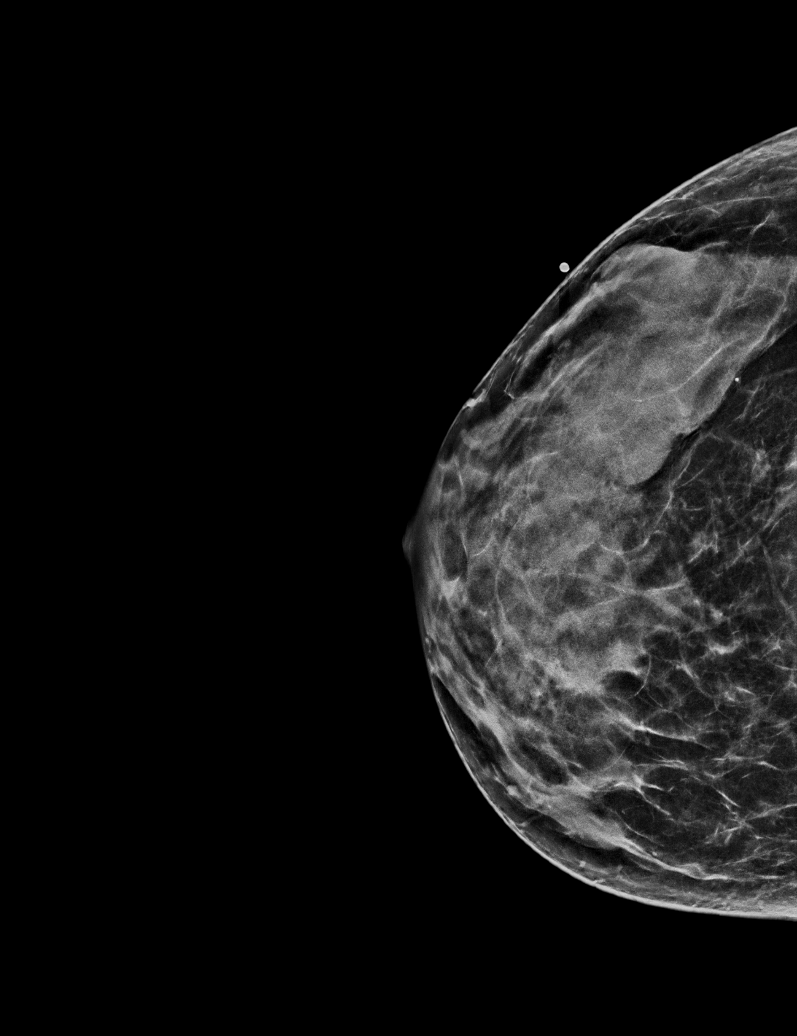

[6 of 36 positions shown; findings below may reference images not displayed]

ACR Breast Density Category c: The breast tissue is heterogeneously
dense, which may obscure small masses.
FINDINGS: Oval, circumscribed mass in the upper outer right breast at the
location of the mass felt by the patient, marked with a metallic
marker. There are 2 adjacent partially obscured, similar-appearing
masses in that area of the breast.

No abnormality is demonstrated in the retroareolar or periareolar
right breast. No findings suspicious for malignancy on the left.

Targeted ultrasound is performed, showing a 2.1 cm simple cyst in
the 9 o'clock position of the right breast, 5 cm from the nipple,
corresponding to the palpable mass. There are 2 nearby similar sized
cysts corresponding to the masses seen mammographically. Multiple
additional smaller cysts are also demonstrated. No solid masses were
seen.

Ultrasound of the retroareolar region of the right breast
demonstrated no abnormalities.
IMPRESSION: 1. Benign right breast cysts.
2. Benign right nipple discharge.
3. No evidence of malignancy in either breast.

RECOMMENDATION:
Annual screening mammography beginning at age 40.

I have discussed the findings and recommendations with the patient.
If applicable, a reminder letter will be sent to the patient
regarding the next appointment.

BI-RADS CATEGORY  2: Benign.

## 2023-01-13 ENCOUNTER — Emergency Department (HOSPITAL_COMMUNITY): Payer: Medicaid Other

## 2023-01-13 ENCOUNTER — Encounter (HOSPITAL_COMMUNITY): Payer: Self-pay

## 2023-01-13 ENCOUNTER — Emergency Department (HOSPITAL_COMMUNITY)
Admission: EM | Admit: 2023-01-13 | Discharge: 2023-01-13 | Disposition: A | Discharge status: Home / Self Care | Payer: Worker's Compensation | Attending: Emergency Medicine | Admitting: Emergency Medicine

## 2023-01-13 DIAGNOSIS — S93402A Sprain of unspecified ligament of left ankle, initial encounter: Secondary | ICD-10-CM | POA: Diagnosis not present

## 2023-01-13 DIAGNOSIS — X501XXA Overexertion from prolonged static or awkward postures, initial encounter: Secondary | ICD-10-CM | POA: Insufficient documentation

## 2023-01-13 DIAGNOSIS — Y990 Civilian activity done for income or pay: Secondary | ICD-10-CM | POA: Diagnosis not present

## 2023-01-13 DIAGNOSIS — M25572 Pain in left ankle and joints of left foot: Secondary | ICD-10-CM | POA: Diagnosis present

## 2023-01-13 NOTE — Discharge Instructions (Addendum)
Your ankle x-ray was negative.  You can take Tylenol and ibuprofen at home for pain.  Use crutches over the next few days.  Bear weight as tolerated.  Follow-up with your regular doctor.  Your ankle will likely be sore for the next 1 to 2 weeks.

## 2023-01-13 NOTE — ED Triage Notes (Signed)
Pt arrived via POV, c/o left ankle pain after twisting ankle while getting out of van at work.

## 2023-01-13 NOTE — ED Provider Notes (Signed)
  [place] EMERGENCY DEPARTMENT AT [facility] Provider Note   CSN: [id number] Arrival date & time: 01/13/23  1601     History  Chief Complaint  Patient presents with   Ankle Pain    Fronnie Scheulen is a 37 y.o. female.  37 year old female here today with left ankle pain.  On Friday, patient was stepping down from her work [employer] and rolled her ankle.  She has been having a difficult time ambulating since.        Home Medications Prior to Admission medications   Medication Sig Start Date End Date Taking? Authorizing Provider  Iron-FA-B Cmp-C-Biot-Probiotic (FUSION PLUS) CAPS Take 1 capsule by mouth daily at 6 (six) AM. 07/14/21   Dorothyann Peng, MD      Allergies    Patient has no known allergies.    Review of Systems   Review of Systems  Physical Exam Updated Vital Signs BP (!) 144/75 (BP Location: Left Arm)   Pulse 89   Temp 98.7 F (37.1 C) (Oral)   Resp 15   SpO2 100%  Physical Exam Musculoskeletal:     Comments: Left ankle-no skin breakdown, no significant swelling or erythema of the ankle.  Mild lateral and medial malleoli tenderness, no tenderness over the fifth metatarsal.  Neurological:     Mental Status: She is alert.     ED Results / Procedures / Treatments   Labs (all labs ordered are listed, but only abnormal results are displayed) Labs Reviewed - No data to display  EKG None  Radiology DG Ankle 2 Views Left  Result Date: 01/13/2023 CLINICAL DATA:  Ankle injury. EXAM: LEFT ANKLE - 2 VIEW COMPARISON:  None Available. FINDINGS: Two views of the left ankle. No evidence of fracture, dislocation, or joint effusion. There is no evidence of arthropathy or other focal bone abnormality. Soft tissues are unremarkable. IMPRESSION: Negative left ankle radiographs. Electronically Signed   By: Orvan Falconer M.D.   On: 01/13/2023 17:12    Procedures Procedures    Medications Ordered in ED Medications - No data to display  ED Course/ Medical  Decision Making/ A&P                                 Medical Decision Making 37 year old female here today with ankle pain.  Differential diagnosis include ankle sprain, less likely ankle fracture.  Plan-plain films ordered.  Reassessment-negative plain films.  Discharged with crutches.  Amount and/or Complexity of Data Reviewed Radiology: ordered.           Final Clinical Impression(s) / ED Diagnoses Final diagnoses:  Sprain of left ankle, unspecified ligament, initial encounter    Rx / DC Orders ED Discharge Orders     None         Anders Simmonds T, DO 01/13/23 1719
